# Patient Record
Sex: Male | Born: 1995 | Race: Black or African American | Hispanic: No | Marital: Single | Smoking: Never smoker
Health system: Southern US, Community
[De-identification: ages and names within clinical notes are randomized; demographics above are authoritative.]

---
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
## 2015-09-14 ENCOUNTER — Emergency Department (HOSPITAL_COMMUNITY)
Admission: EM | Admit: 2015-09-14 | Discharge: 2015-09-15 | Disposition: A | Discharge status: Home / Self Care | Payer: Medicaid Other | Attending: Emergency Medicine | Admitting: Emergency Medicine

## 2015-09-14 ENCOUNTER — Encounter (HOSPITAL_COMMUNITY): Payer: Self-pay | Admitting: Emergency Medicine

## 2015-09-14 DIAGNOSIS — S6991XA Unspecified injury of right wrist, hand and finger(s), initial encounter: Secondary | ICD-10-CM | POA: Insufficient documentation

## 2015-09-14 DIAGNOSIS — W228XXA Striking against or struck by other objects, initial encounter: Secondary | ICD-10-CM | POA: Diagnosis not present

## 2015-09-14 DIAGNOSIS — Y998 Other external cause status: Secondary | ICD-10-CM | POA: Diagnosis not present

## 2015-09-14 DIAGNOSIS — M79641 Pain in right hand: Secondary | ICD-10-CM

## 2015-09-14 DIAGNOSIS — Y9289 Other specified places as the place of occurrence of the external cause: Secondary | ICD-10-CM | POA: Diagnosis not present

## 2015-09-14 DIAGNOSIS — Y9389 Activity, other specified: Secondary | ICD-10-CM | POA: Insufficient documentation

## 2015-09-14 MED ORDER — IBUPROFEN 400 MG PO TABS
800.0000 mg | ORAL_TABLET | Freq: Once | ORAL | Status: AC
  Administered 2015-09-14: 800 mg via ORAL
  Filled 2015-09-14: qty 2

## 2015-09-14 NOTE — ED Notes (Signed)
Pt. accidentally hit his right hand against a car's trunk while horse playing this evening , presents with left hand pain and mild swelling .

## 2015-09-14 NOTE — ED Provider Notes (Signed)
CSN: [id number]     Arrival date & time 09/14/15  2248 History  By signing my name below, I, Matthew Brewer, attest that this documentation has been prepared under the direction and in the presence of Roxy Horseman, PA-C. Electronically Signed: Doreatha Brewer, ED Scribe. 09/14/2015. 11:45 PM.    Chief Complaint  Patient presents with  . Hand Injury   The history is provided by the patient. No language interpreter was used.   HPI Comments: Matthew Brewer is a 20 y.o. male who presents to the Emergency Department with a chief complaint of moderate right hand pain s/p injury tonight. Pt states that he accidentally punched the hood of his car with a closed fist, but was aiming for a person. He states pain is worsened with movement. He is able to flex and extend the wrist and fingers. Denies numbness, additional injuries.   History reviewed. No pertinent past medical history. History reviewed. No pertinent past surgical history. Family History  Problem Relation Age of Onset  . Cancer Other    Social History  Substance Use Topics  . Smoking status: Never Smoker   . Smokeless tobacco: None  . Alcohol Use: No     Comment: only occasional use    Review of Systems  Musculoskeletal: Positive for arthralgias (right hand).  Neurological: Negative for numbness.   Allergies  Review of patient's allergies indicates no known allergies.  Home Medications   Prior to Admission medications   Medication Sig Start Date End Date Taking? Authorizing Provider  albuterol (PROVENTIL HFA;VENTOLIN HFA) 108 (90 BASE) MCG/ACT inhaler Inhale 2 puffs into the lungs every 2 (two) hours as needed for wheezing or shortness of breath (cough). 11/19/13   Junious Silk, PA-C  cetirizine (ZYRTEC ALLERGY) 10 MG tablet Take 1 tablet (10 mg total) by mouth daily. 11/05/13   Hannah Muthersbaugh, PA-C  diphenhydrAMINE (BENADRYL) 25 MG tablet Take 25 mg by mouth every 6 (six) hours as needed for allergies.    Historical Provider,  MD  loratadine (CLARITIN) 10 MG tablet Take 1 tablet (10 mg total) by mouth daily. 11/19/13   Junious Silk, PA-C  predniSONE (DELTASONE) 20 MG tablet Take 2 tablets (40 mg total) by mouth daily. 11/05/13   Hannah Muthersbaugh, PA-C   BP 127/73 mmHg  Pulse 89  Temp(Src) 98.3 F (36.8 C) (Oral)  Resp 16  SpO2 99% Physical Exam Physical Exam  Constitutional: Pt appears well-developed and well-nourished. No distress.  HENT:  Head: Normocephalic and atraumatic.  Eyes: Conjunctivae are normal.  Neck: Normal range of motion.  Cardiovascular: Normal rate, regular rhythm and intact distal pulses.   Capillary refill < 3 sec  Pulmonary/Chest: Effort normal and breath sounds normal.  Musculoskeletal: Pt exhibits tenderness to right 4th and 5th metacarpals. Pt exhibits no edema.  ROM: 4/5 limited secondary to pain  Neurological: Pt  is alert. Coordination normal.  Sensation 5/5 Strength 4/5 limited secondary to pain   Skin: Skin is warm and dry. Pt is not diaphoretic.  No tenting of the skin  Psychiatric: Pt has a normal mood and affect.  Nursing note and vitals reviewed.   ED Course  Procedures (including critical care time) DIAGNOSTIC STUDIES: Oxygen Saturation is 99% on RA, normal by my interpretation.    COORDINATION OF CARE: 11:43 PM Discussed treatment plan with pt at bedside which includes XR and pt agreed to plan.  Imaging Review Dg Hand Complete Right  09/15/2015  CLINICAL DATA:  Right fourth and fifth metacarpal pain and  swelling after punching the truck of his car. EXAM: RIGHT HAND - COMPLETE 3+ VIEW COMPARISON:  07/23/2011 FINDINGS: Soft tissue swelling over the metacarpal phalangeal joints. No evidence of acute fracture or subluxation. No focal bone lesion or bone destruction. Bone cortex and trabecular architecture appear intact. No radiopaque soft tissue foreign bodies. IMPRESSION: Soft tissue swelling.  No acute bony abnormalities. Electronically Signed   By: Burman NievesWilliam   Stevens M.D.   On: 09/15/2015 01:03   I have personally reviewed and evaluated these images as part of my medical decision-making.   MDM   Final diagnoses:  Right hand pain    Patient X-Ray negative for obvious fracture or dislocation.  Pt advised to follow up with PCP. Patient given splint while in ED, conservative therapy recommended and discussed. Patient will be discharged home & is agreeable with above plan. Returns precautions discussed. Pt appears safe for discharge.   I personally performed the services described in this documentation, which was scribed in my presence. The recorded information has been reviewed and is accurate.     Roxy Horsemanobert Danyon Mcginness, PA-C 09/15/15 0109  Laurence Spatesachel Morgan Little, MD 09/15/15 [phone]

## 2015-09-15 ENCOUNTER — Emergency Department (HOSPITAL_COMMUNITY): Payer: Medicaid Other

## 2015-09-15 MED ORDER — IBUPROFEN 800 MG PO TABS: 800.0000 mg | ORAL_TABLET | Freq: Three times a day (TID) | ORAL | Status: AC

## 2015-09-15 NOTE — ED Notes (Signed)
Pt verbalized understanding of discharge instructions and follow-up care. Demonstrated appropriate application of wrist splint. 

## 2015-09-15 NOTE — Discharge Instructions (Signed)
Intermetacarpal Sprain °The intermetacarpal ligaments run between the knuckles at the base of the fingers. These ligaments are vulnerable to sprain and injury in which the ligament becomes overstretched or torn. Intermetacarpal sprains are classified into 3 categories. Grade 1 sprains cause pain, but the tendon is not lengthened. Grade 2 sprains include a lengthened ligament, due to the ligament being stretched or partially ruptured. With grade 2 sprains there is still function, although function may be decreased. Grade 3 sprains include a complete tear of the ligament, and the joint usually displays a loss of function.  °SYMPTOMS  °· Severe pain at the time of injury. °· Often, a feeling of popping or tearing inside the hand. °· Tenderness and inflammation at the knuckles. °· Bruising within a couple days of injury. °· Impaired ability to use the hand. °CAUSES  °This condition occurs when the intermetacarpal ligaments are subjected to a greater stress than they can handle. This causes the ligaments to become stretched or torn. °RISK INCREASES WITH: °· Previous hand injury. °· Fighting sports (boxing, wrestling, martial arts). °· Sports in which you could fall on an outstretched hand (soccer, basketball, volleyball). °· Other sports with repeated hand trauma (water polo, gymnastics). °· Poor hand strength and flexibility. °· Inadequate or poorly fitted protective equipment. °PREVENTION  °· Warm up and stretch properly before activity. °· Maintain appropriate conditioning: °¨ Hand flexibility. °¨ Muscle strength and endurance. °· Applying tape, protective strapping, or a brace may help prevent injury. °· Provide the hand with support during sports and practice activities for 6 to 12 months following injury. °PROGNOSIS  °With proper treatment, healing should occur without impairment. The length of healing varies from 2 to 12 weeks, depending on the severity of injury. °RELATED COMPLICATIONS  °· Longer healing time, if  activities are resumed too soon. °· Recurring symptoms or repeated injury, resulting in a chronic problem. °· Injury to other nearby structures (bone, cartilage, tendon). °· Arthritis of the knuckle (intermetacarpal) joint, with repeated sprains. °· Prolonged disability (sometimes). °· Hand and finger stiffness or weakness. °TREATMENT °Treatment first involves ice and medicine to reduce pain and inflammation. An elastic compression bandage may be worn to reduce discomfort and to protect the area. Depending on the severity of injury, you may be required to restrain the area with a cast, splint, or brace. After the ligament has been allowed to heal, strengthening and stretching exercises may be needed to regain strength and a full range of motion. Exercises may be completed at home or with a therapist. Surgery is rarely needed. °MEDICATION  °· If pain medicine is needed, nonsteroidal anti-inflammatory medicines (aspirin and ibuprofen), or other minor pain relievers (acetaminophen), are often advised. °· Do not take pain medicine for 7 days before surgery. °· Stronger pain relievers may be prescribed if your caregiver thinks they are needed. Use only as directed and only as much as you need. °HEAT AND COLD °· Cold treatment (icing) should be applied for 10 to 15 minutes every 2 to 3 hours for inflammation and pain, and immediately after activity that aggravates your symptoms. Use ice packs or an ice massage. °· Heat treatment may be used before performing stretching and strengthening activities prescribed by your caregiver, physical therapist, or athletic trainer. Use a heat pack or a warm water soak. °SEEK MEDICAL CARE IF:  °· Symptoms remain or get worse, despite treatment for longer than 2 to 4 weeks. °· You experience pain, numbness, discoloration, or coldness in the hand or fingers. °·   You develop blue, gray, or dark fingernails. °· Any of the following occur after surgery: increased pain, swelling, redness,  drainage of fluids, bleeding in the affected area, or signs of infection, including fever. °· New, unexplained symptoms develop. (Drugs used in treatment may produce side effects.) °  °This information is not intended to replace advice given to you by your health care provider. Make sure you discuss any questions you have with your health care provider. °  °Document Released: 05/17/2005 Document Revised: 06/07/2014 Document Reviewed: 11/04/2014 °Elsevier Interactive Patient Education ©2016 [employer] ° °

## 2016-10-11 ENCOUNTER — Emergency Department (HOSPITAL_COMMUNITY)
Admission: EM | Admit: 2016-10-11 | Discharge: 2016-10-11 | Disposition: A | Discharge status: Home / Self Care | Payer: Medicaid Other | Attending: Emergency Medicine | Admitting: Emergency Medicine

## 2016-10-11 ENCOUNTER — Encounter (HOSPITAL_COMMUNITY): Payer: Self-pay

## 2016-10-11 DIAGNOSIS — R369 Urethral discharge, unspecified: Secondary | ICD-10-CM

## 2016-10-11 DIAGNOSIS — R360 Urethral discharge without blood: Secondary | ICD-10-CM | POA: Insufficient documentation

## 2016-10-11 DIAGNOSIS — Z79899 Other long term (current) drug therapy: Secondary | ICD-10-CM | POA: Insufficient documentation

## 2016-10-11 LAB — URINALYSIS, ROUTINE W REFLEX MICROSCOPIC
BILIRUBIN URINE: NEGATIVE
Glucose, UA: NEGATIVE mg/dL
Hgb urine dipstick: NEGATIVE
Ketones, ur: NEGATIVE mg/dL
Leukocytes, UA: NEGATIVE
Nitrite: NEGATIVE
PH: 7 (ref 5.0–8.0)
Protein, ur: NEGATIVE mg/dL
SPECIFIC GRAVITY, URINE: 1.018 (ref 1.005–1.030)

## 2016-10-11 LAB — URINE CYTOLOGY ANCILLARY ONLY
Chlamydia: NEGATIVE
Neisseria Gonorrhea: NEGATIVE

## 2016-10-11 LAB — GC/CHLAMYDIA PROBE AMP (~~LOC~~) NOT AT ARMC
Chlamydia: NEGATIVE
Neisseria Gonorrhea: NEGATIVE

## 2016-10-11 MED ORDER — STERILE WATER FOR INJECTION IJ SOLN
INTRAMUSCULAR | Status: AC
  Filled 2016-10-11: qty 10

## 2016-10-11 MED ORDER — CEFTRIAXONE SODIUM 250 MG IJ SOLR
250.0000 mg | Freq: Once | INTRAMUSCULAR | Status: AC
  Administered 2016-10-11: 250 mg via INTRAMUSCULAR
  Filled 2016-10-11: qty 250

## 2016-10-11 MED ORDER — AZITHROMYCIN 250 MG PO TABS
1000.0000 mg | ORAL_TABLET | Freq: Once | ORAL | Status: AC
  Administered 2016-10-11: 1000 mg via ORAL
  Filled 2016-10-11: qty 4

## 2016-10-11 NOTE — ED Notes (Signed)
Pt understood dc material. NAD noted. 

## 2016-10-11 NOTE — ED Provider Notes (Signed)
[facility]-EMERGENCY DEPT Provider Note   CSN: [id number] Arrival date & time: 10/11/16  0044     History   Chief Complaint Chief Complaint  Patient presents with  . Exposure to STD    HPI Matthew Brewer is a 21 y.o. male.  Patient is here for evaluation of penile discharge. He had symptoms intermittently over the last 2 days. No abdominal, testicular or scrotal pain. No nausea, vomiting or fever. No dysuria.    The history is provided by the patient. No language interpreter was used.  Exposure to STD  Pertinent negatives include no abdominal pain.    History reviewed. No pertinent past medical history.  Patient Active Problem List   Diagnosis Date Noted  . Major depression, single episode 02/03/2013  . Suicide and self-inflicted injury ([facility]) 02/02/2013    History reviewed. No pertinent surgical history.     Home Medications    Prior to Admission medications   Medication Sig Start Date End Date Taking? Authorizing Provider  albuterol (PROVENTIL HFA;VENTOLIN HFA) 108 (90 BASE) MCG/ACT inhaler Inhale 2 puffs into the lungs every 2 (two) hours as needed for wheezing or shortness of breath (cough). 11/19/13   Junious SilkMerrell, Hannah, PA-C  cetirizine (ZYRTEC ALLERGY) 10 MG tablet Take 1 tablet (10 mg total) by mouth daily. 11/05/13   Muthersbaugh, Dahlia ClientHannah, PA-C  diphenhydrAMINE (BENADRYL) 25 MG tablet Take 25 mg by mouth every 6 (six) hours as needed for allergies.    [provider]  ibuprofen (ADVIL,MOTRIN) 800 MG tablet Take 1 tablet (800 mg total) by mouth 3 (three) times daily. 09/15/15   Roxy HorsemanBrowning, Robert, PA-C  loratadine (CLARITIN) 10 MG tablet Take 1 tablet (10 mg total) by mouth daily. 11/19/13   Junious SilkMerrell, Hannah, PA-C  predniSONE (DELTASONE) 20 MG tablet Take 2 tablets (40 mg total) by mouth daily. 11/05/13   Muthersbaugh, Dahlia ClientHannah, PA-C    Family History Family History  Problem Relation Age of Onset  . Cancer Other     Social History Social History  Substance Use  Topics  . Smoking status: Never Smoker  . Smokeless tobacco: Never Used  . Alcohol use No     Comment: only occasional use     Allergies   Patient has no known allergies.   Review of Systems Review of Systems  Constitutional: Negative for fever.  Gastrointestinal: Negative for abdominal pain and nausea.  Genitourinary: Positive for discharge. Negative for dysuria.  Musculoskeletal: Negative for myalgias.     Physical Exam Updated Vital Signs BP 126/62 (BP Location: Right Arm)   Pulse 73   Temp 99.2 F (37.3 C) (Oral)   Resp 14   Ht 6' (1.829 m)   Wt 70.3 kg   SpO2 100%   BMI 21.02 kg/m   Physical Exam  Constitutional: He is oriented to person, place, and time. He appears well-developed and well-nourished.  Neck: Normal range of motion.  Pulmonary/Chest: Effort normal.  Genitourinary:  Genitourinary Comments: Circumcised penis without visualized discharge. No testicular tenderness or scrotal swelling.  Musculoskeletal: Normal range of motion.  Lymphadenopathy:       Right: Inguinal adenopathy present.       Left: Inguinal adenopathy present.  Neurological: He is alert and oriented to person, place, and time.  Skin: Skin is warm and dry.  Psychiatric: He has a normal mood and affect.     ED Treatments / Results  Labs (all labs ordered are listed, but only abnormal results are displayed) Labs Reviewed  URINALYSIS, ROUTINE W REFLEX  MICROSCOPIC - Abnormal; Notable for the following:       Result Value   APPearance HAZY (*)    All other components within normal limits  GC/CHLAMYDIA PROBE AMP ([place]) NOT AT [facility]    EKG  EKG Interpretation None       Radiology No results found.  Procedures Procedures (including critical care time)  Medications Ordered in ED Medications  azithromycin (ZITHROMAX) tablet 1,000 mg (not administered)  cefTRIAXone (ROCEPHIN) injection 250 mg (not administered)     Initial Impression / Assessment and Plan / ED  Course  I have reviewed the triage vital signs and the nursing notes.  Pertinent labs & imaging results that were available during my care of the patient were reviewed by me and considered in my medical decision making (see chart for details).     Patient with reported penile discharge, none on exam tonight. Cultures pending. Patient is treated with Azithromycin and Rocephin.   Final Clinical Impressions(s) / ED Diagnoses   Final diagnoses:  Penile discharge    New Prescriptions New Prescriptions   No medications on file     Danne Harbor 10/11/16 4540    Dione Booze, MD 10/11/16 [phone]

## 2016-10-11 NOTE — ED Triage Notes (Signed)
Pt complaining of some penile discharge. Pt denies any bleeding. Pt denies any abdominal or testicular pain.

## 2017-06-30 ENCOUNTER — Encounter (HOSPITAL_COMMUNITY): Payer: Self-pay

## 2017-06-30 ENCOUNTER — Other Ambulatory Visit: Payer: Self-pay

## 2017-06-30 ENCOUNTER — Ambulatory Visit (HOSPITAL_COMMUNITY)
Admission: EM | Admit: 2017-06-30 | Discharge: 2017-06-30 | Disposition: A | Discharge status: Home / Self Care | Payer: Medicaid Other | Attending: Family Medicine | Admitting: Family Medicine

## 2017-06-30 DIAGNOSIS — R300 Dysuria: Secondary | ICD-10-CM | POA: Diagnosis not present

## 2017-06-30 DIAGNOSIS — Z202 Contact with and (suspected) exposure to infections with a predominantly sexual mode of transmission: Secondary | ICD-10-CM

## 2017-06-30 DIAGNOSIS — R369 Urethral discharge, unspecified: Secondary | ICD-10-CM | POA: Diagnosis not present

## 2017-06-30 DIAGNOSIS — Z113 Encounter for screening for infections with a predominantly sexual mode of transmission: Secondary | ICD-10-CM | POA: Diagnosis not present

## 2017-06-30 DIAGNOSIS — F329 Major depressive disorder, single episode, unspecified: Secondary | ICD-10-CM | POA: Insufficient documentation

## 2017-06-30 DIAGNOSIS — Z711 Person with feared health complaint in whom no diagnosis is made: Secondary | ICD-10-CM

## 2017-06-30 LAB — POCT URINALYSIS DIP (DEVICE)
Bilirubin Urine: NEGATIVE
Glucose, UA: NEGATIVE mg/dL
Hgb urine dipstick: NEGATIVE
Ketones, ur: NEGATIVE mg/dL
Leukocytes, UA: NEGATIVE
Nitrite: NEGATIVE
PH: 7 (ref 5.0–8.0)
PROTEIN: NEGATIVE mg/dL
Specific Gravity, Urine: 1.025 (ref 1.005–1.030)
Urobilinogen, UA: 2 mg/dL — ABNORMAL HIGH (ref 0.0–1.0)

## 2017-06-30 MED ORDER — LIDOCAINE HCL (PF) 2 % IJ SOLN
INTRAMUSCULAR | Status: AC
  Filled 2017-06-30: qty 2

## 2017-06-30 MED ORDER — CEFTRIAXONE SODIUM 250 MG IJ SOLR
INTRAMUSCULAR | Status: AC
Start: 2017-06-30 — End: 2017-06-30
  Filled 2017-06-30: qty 250

## 2017-06-30 MED ORDER — AZITHROMYCIN 250 MG PO TABS
ORAL_TABLET | ORAL | Status: AC
  Filled 2017-06-30: qty 4

## 2017-06-30 MED ORDER — CEFTRIAXONE SODIUM 250 MG IJ SOLR
250.0000 mg | Freq: Once | INTRAMUSCULAR | Status: AC
  Administered 2017-06-30: 250 mg via INTRAMUSCULAR

## 2017-06-30 MED ORDER — AZITHROMYCIN 250 MG PO TABS
1000.0000 mg | ORAL_TABLET | Freq: Once | ORAL | Status: AC
  Administered 2017-06-30: 1000 mg via ORAL

## 2017-06-30 MED ORDER — LIDOCAINE HCL (PF) 1 % IJ SOLN
INTRAMUSCULAR | Status: AC
  Filled 2017-06-30: qty 2

## 2017-06-30 NOTE — Discharge Instructions (Signed)
We have treated you today for gonorrhea and chlamydia.  °Will notify you of any positive findings and if any changes to treatment are needed.   °Please withhold from intercourse for the next week. °Please use condoms to prevent STD's.   °

## 2017-06-30 NOTE — ED Triage Notes (Signed)
[facility], pt is experiencing discharge from penis and just uncomfortable feeling.
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
## 2017-07-01 LAB — URINE CYTOLOGY ANCILLARY ONLY
CHLAMYDIA, DNA PROBE: POSITIVE — AB
NEISSERIA GONORRHEA: NEGATIVE
Trichomonas: NEGATIVE

## 2018-02-09 ENCOUNTER — Emergency Department (HOSPITAL_COMMUNITY): Payer: Medicaid Other

## 2018-02-09 ENCOUNTER — Emergency Department (HOSPITAL_COMMUNITY)
Admission: EM | Admit: 2018-02-09 | Discharge: 2018-02-09 | Discharge status: Against Medical Advice | Payer: Medicaid Other | Attending: Emergency Medicine | Admitting: Emergency Medicine

## 2018-02-09 DIAGNOSIS — Z5321 Procedure and treatment not carried out due to patient leaving prior to being seen by health care provider: Secondary | ICD-10-CM | POA: Insufficient documentation

## 2018-02-09 DIAGNOSIS — R52 Pain, unspecified: Secondary | ICD-10-CM

## 2018-02-09 DIAGNOSIS — M25532 Pain in left wrist: Secondary | ICD-10-CM | POA: Insufficient documentation

## 2018-06-09 DIAGNOSIS — Z79899 Other long term (current) drug therapy: Secondary | ICD-10-CM | POA: Insufficient documentation

## 2018-06-09 DIAGNOSIS — H109 Unspecified conjunctivitis: Secondary | ICD-10-CM | POA: Insufficient documentation

## 2018-06-09 DIAGNOSIS — J028 Acute pharyngitis due to other specified organisms: Secondary | ICD-10-CM | POA: Insufficient documentation

## 2018-06-09 DIAGNOSIS — B970 Adenovirus as the cause of diseases classified elsewhere: Secondary | ICD-10-CM | POA: Insufficient documentation

## 2018-06-10 ENCOUNTER — Emergency Department (HOSPITAL_COMMUNITY)
Admission: EM | Admit: 2018-06-10 | Discharge: 2018-06-10 | Disposition: A | Discharge status: Home / Self Care | Payer: Medicaid Other | Attending: Emergency Medicine | Admitting: Emergency Medicine

## 2018-06-10 ENCOUNTER — Encounter (HOSPITAL_COMMUNITY): Payer: Self-pay | Admitting: Emergency Medicine

## 2018-06-10 DIAGNOSIS — B970 Adenovirus as the cause of diseases classified elsewhere: Secondary | ICD-10-CM

## 2018-06-10 DIAGNOSIS — J028 Acute pharyngitis due to other specified organisms: Secondary | ICD-10-CM

## 2018-06-10 LAB — GROUP A STREP BY PCR: GROUP A STREP BY PCR: NOT DETECTED
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
## 2018-06-10 NOTE — Discharge Instructions (Signed)
Take tylenol 2 pills 4 times a day and motrin 4 pills 3 times a day.  Drink plenty of fluids.  Return for worsening shortness of breath, headache, confusion. Follow up with your family doctor.   

## 2018-06-10 NOTE — ED Triage Notes (Signed)
Pt reports sore throat X few days. Mild tonsil irritation noted.

## 2018-12-29 ENCOUNTER — Encounter (HOSPITAL_COMMUNITY): Payer: Self-pay | Admitting: Emergency Medicine

## 2018-12-29 ENCOUNTER — Ambulatory Visit (HOSPITAL_COMMUNITY)
Admission: EM | Admit: 2018-12-29 | Discharge: 2018-12-29 | Disposition: A | Discharge status: Home / Self Care | Payer: Medicaid Other | Attending: Urgent Care | Admitting: Urgent Care

## 2018-12-29 ENCOUNTER — Other Ambulatory Visit: Payer: Self-pay

## 2018-12-29 DIAGNOSIS — S5011XA Contusion of right forearm, initial encounter: Secondary | ICD-10-CM

## 2018-12-29 DIAGNOSIS — S46911A Strain of unspecified muscle, fascia and tendon at shoulder and upper arm level, right arm, initial encounter: Secondary | ICD-10-CM

## 2018-12-29 DIAGNOSIS — M62838 Other muscle spasm: Secondary | ICD-10-CM

## 2018-12-29 DIAGNOSIS — W1789XA Other fall from one level to another, initial encounter: Secondary | ICD-10-CM

## 2018-12-29 DIAGNOSIS — W19XXXA Unspecified fall, initial encounter: Secondary | ICD-10-CM

## 2018-12-29 DIAGNOSIS — Y9259 Other trade areas as the place of occurrence of the external cause: Secondary | ICD-10-CM

## 2018-12-29 DIAGNOSIS — S46811A Strain of other muscles, fascia and tendons at shoulder and upper arm level, right arm, initial encounter: Secondary | ICD-10-CM

## 2018-12-29 DIAGNOSIS — S5001XA Contusion of right elbow, initial encounter: Secondary | ICD-10-CM

## 2018-12-29 MED ORDER — NAPROXEN 500 MG PO TABS: 500.0000 mg | ORAL_TABLET | Freq: Two times a day (BID) | ORAL | 0 refills | Status: DC

## 2018-12-29 MED ORDER — CYCLOBENZAPRINE HCL 5 MG PO TABS: 5.0000 mg | ORAL_TABLET | Freq: Three times a day (TID) | ORAL | 0 refills | Status: DC | PRN

## 2018-12-29 NOTE — ED Provider Notes (Signed)
  MRN: [id number] DOB: December 16, 1995  Subjective:   Matthew Brewer is a 23 y.o. male presenting for worker's comp visit. Patient fell from a box while at work, lost his footing. He broke his fall landing on his right elbow and has had acute worsening persistent moderate elbow pain, right shoulder pain, trapezius pain, upper right back pain worse when trying to bend at level of his neck. Tried Aleve once this morning without any relief.    Matthew Brewer's medications list, allergies, past medical history and past surgical history were reviewed and excluded from this note due to being a worker's comp case.   Objective:   Vitals: BP 131/81 (BP Location: Right Arm)   Pulse 80   Temp 98.4 F (36.9 C) (Oral)   Resp 18   SpO2 96%   Physical Exam Constitutional:      Appearance: Normal appearance. He is well-developed and normal weight.  HENT:     Head: Normocephalic and atraumatic.     Right Ear: External ear normal.     Left Ear: External ear normal.     Nose: Nose normal.     Mouth/Throat:     Pharynx: Oropharynx is clear.  Eyes:     Extraocular Movements: Extraocular movements intact.     Pupils: Pupils are equal, round, and reactive to light.  Cardiovascular:     Rate and Rhythm: Normal rate.  Pulmonary:     Effort: Pulmonary effort is normal.  Musculoskeletal:     Right shoulder: He exhibits decreased range of motion (Secondary to pain of his trapezius) and spasm (Significant over right trapezius extending up to paraspinal muscles at cervical region). He exhibits no tenderness, no bony tenderness, no swelling, no effusion, no crepitus and no deformity.     Right elbow: He exhibits normal range of motion, no swelling, no effusion, no deformity and no laceration. Tenderness found. Olecranon process tenderness noted. No radial head, no medial epicondyle and no lateral epicondyle tenderness noted.  Neurological:     Mental Status: He is alert and oriented to person, place, and time.  Psychiatric:         Mood and Affect: Mood normal.        Behavior: Behavior normal.     Assessment and Plan :   1. Trapezius muscle strain, right, initial encounter   2. Fall, initial encounter   3. Contusion of right elbow, initial encounter   4. Shoulder strain, right, initial encounter   5. Trapezius muscle spasm   6. Contusion of right elbow and forearm, initial encounter    We will manage conservatively for musculoskeletal type pain associated from his work injury.  Counseled on use of NSAID, muscle relaxant and modification of physical activities.  Work restrictions provided.  Patient is to follow-up with occupational health.  Counseled patient on potential for adverse effects with medications prescribed/recommended today, ER and return-to-clinic precautions discussed, patient verbalized understanding.   Jaynee Eagles, PA-C [facility] [phone] 12/29/2018 10:56 AM    Jaynee Eagles, PA-C 12/29/18 1109

## 2018-12-29 NOTE — ED Triage Notes (Signed)
Pt here for fall yesterday at work; pt sts back and right shoulder pain; pt sts right elbow pain

## 2020-04-04 IMAGING — CR DG WRIST COMPLETE 3+V*L*
4 series · 4 of 4 positions shown · non-contrast
Comparison: None.

CLINICAL DATA: Left wrist pain and popping.

EXAM:
LEFT WRIST - COMPLETE 3+ VIEW

[wrist pa]
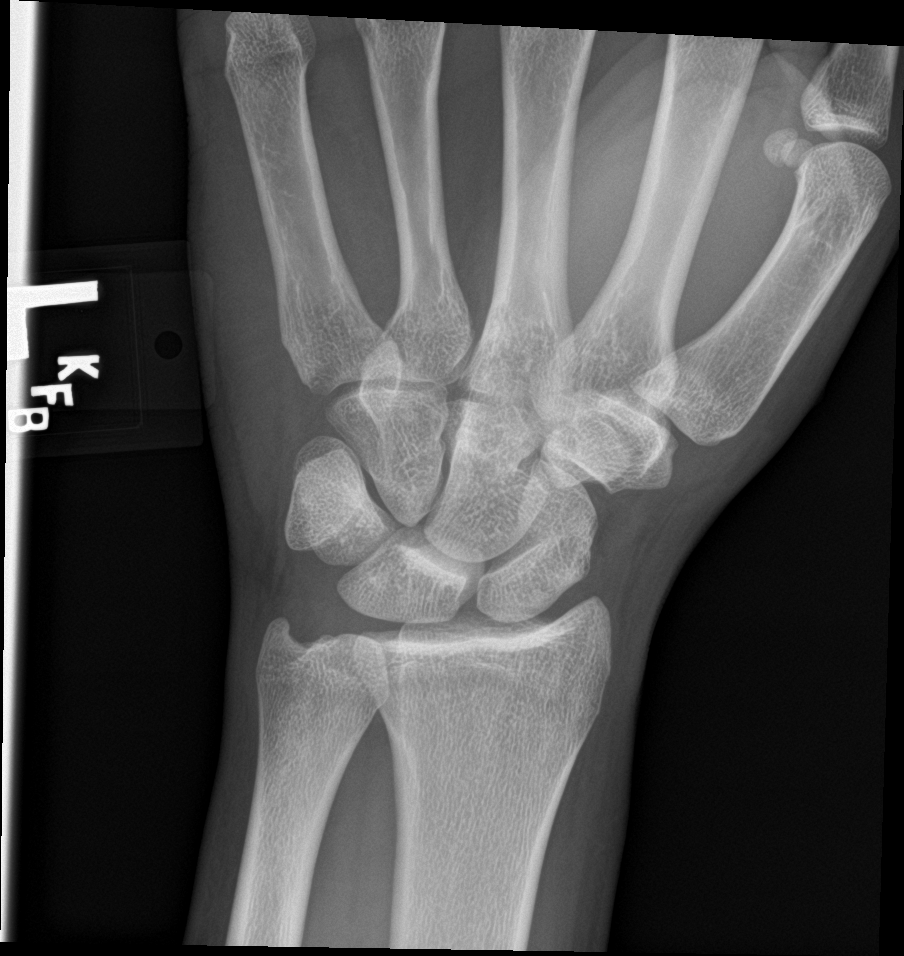

[wrist obl]
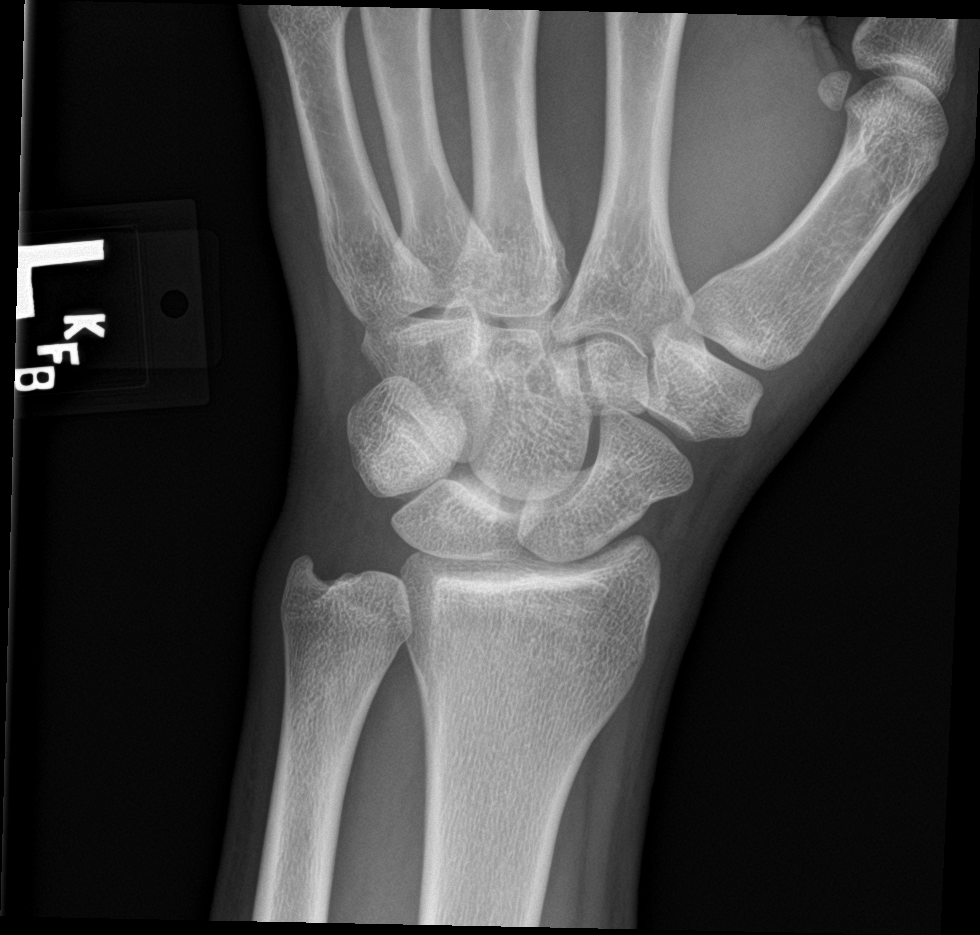

[wrist lat]
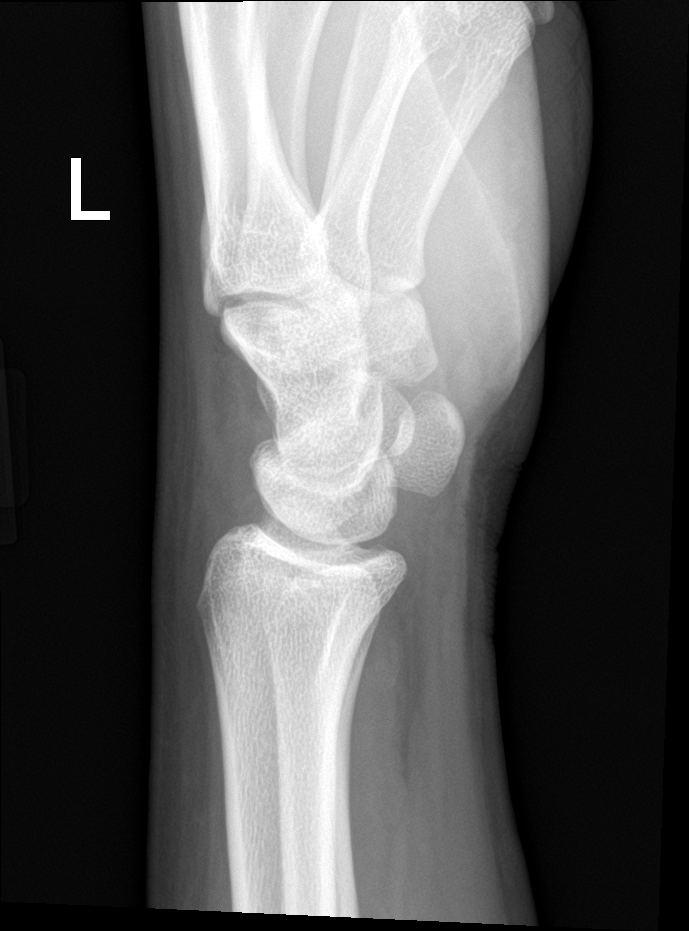

[wrist navicular]
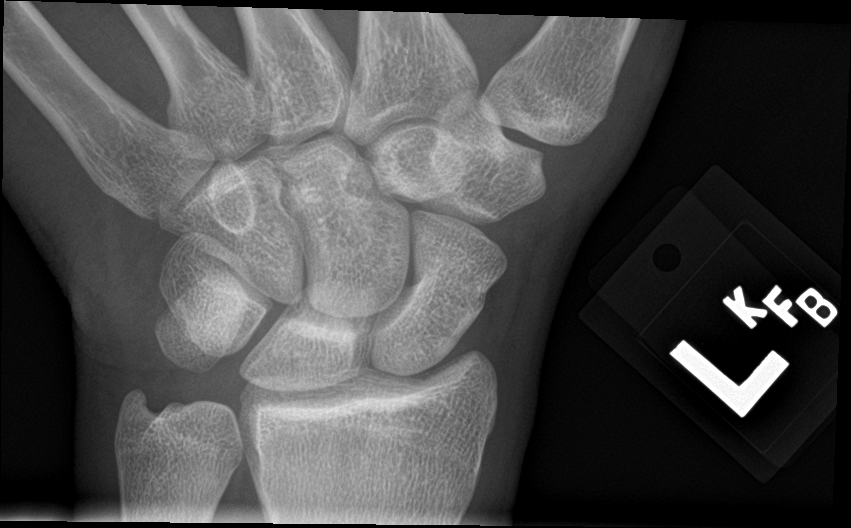

[4 of 4 positions shown; findings below may reference images not displayed]

FINDINGS: There is no evidence of fracture or dislocation. There is no
evidence of arthropathy or other focal bone abnormality. Soft
tissues are unremarkable.
IMPRESSION: Negative.

## 2020-05-11 ENCOUNTER — Emergency Department (HOSPITAL_BASED_OUTPATIENT_CLINIC_OR_DEPARTMENT_OTHER)
Admission: EM | Admit: 2020-05-11 | Discharge: 2020-05-11 | Disposition: A | Discharge status: Home / Self Care | Payer: Medicaid Other | Attending: Emergency Medicine | Admitting: Emergency Medicine

## 2020-05-11 ENCOUNTER — Other Ambulatory Visit: Payer: Self-pay

## 2020-05-11 ENCOUNTER — Encounter (HOSPITAL_BASED_OUTPATIENT_CLINIC_OR_DEPARTMENT_OTHER): Payer: Self-pay | Admitting: Emergency Medicine

## 2020-05-11 DIAGNOSIS — Y92410 Unspecified street and highway as the place of occurrence of the external cause: Secondary | ICD-10-CM | POA: Insufficient documentation

## 2020-05-11 DIAGNOSIS — M545 Low back pain, unspecified: Secondary | ICD-10-CM | POA: Insufficient documentation

## 2020-05-11 MED ORDER — CYCLOBENZAPRINE HCL 5 MG PO TABS: 5.0000 mg | ORAL_TABLET | Freq: Two times a day (BID) | ORAL | 0 refills | Status: DC | PRN

## 2020-05-11 MED ORDER — NAPROXEN 250 MG PO TABS
500.0000 mg | ORAL_TABLET | Freq: Once | ORAL | Status: AC
  Administered 2020-05-11: 500 mg via ORAL
  Filled 2020-05-11: qty 2

## 2020-05-11 MED ORDER — NAPROXEN 500 MG PO TABS: 500.0000 mg | ORAL_TABLET | Freq: Two times a day (BID) | ORAL | 0 refills | Status: DC
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
## 2024-05-08 ENCOUNTER — Ambulatory Visit (HOSPITAL_COMMUNITY): Admission: EM | Admit: 2024-05-08 | Discharge: 2024-05-08 | Discharge status: Against Medical Advice

## 2024-05-08 NOTE — ED Notes (Signed)
Called from lobby no answer X 2  

## 2024-05-08 NOTE — ED Notes (Signed)
 Pt called from lobby no answer. X3

## 2024-05-08 NOTE — ED Notes (Signed)
Called from lobby no answer X 1

## 2024-05-09 DIAGNOSIS — M79641 Pain in right hand: Secondary | ICD-10-CM | POA: Diagnosis not present

## 2024-05-09 DIAGNOSIS — X58XXXA Exposure to other specified factors, initial encounter: Secondary | ICD-10-CM | POA: Diagnosis not present

## 2024-05-09 DIAGNOSIS — S60221A Contusion of right hand, initial encounter: Secondary | ICD-10-CM | POA: Diagnosis not present

## 2024-05-30 ENCOUNTER — Other Ambulatory Visit: Payer: Self-pay

## 2024-05-30 ENCOUNTER — Emergency Department (HOSPITAL_COMMUNITY)
Admission: EM | Admit: 2024-05-30 | Discharge: 2024-05-31 | Disposition: A | Discharge status: Home / Self Care | Attending: Emergency Medicine | Admitting: Emergency Medicine

## 2024-05-30 ENCOUNTER — Encounter (HOSPITAL_COMMUNITY): Payer: Self-pay

## 2024-05-30 ENCOUNTER — Emergency Department (HOSPITAL_COMMUNITY)

## 2024-05-30 DIAGNOSIS — M79641 Pain in right hand: Secondary | ICD-10-CM | POA: Diagnosis not present

## 2024-05-30 DIAGNOSIS — S50811A Abrasion of right forearm, initial encounter: Secondary | ICD-10-CM | POA: Insufficient documentation

## 2024-05-30 DIAGNOSIS — S59911A Unspecified injury of right forearm, initial encounter: Secondary | ICD-10-CM | POA: Diagnosis present

## 2024-05-30 DIAGNOSIS — R519 Headache, unspecified: Secondary | ICD-10-CM | POA: Insufficient documentation

## 2024-05-30 DIAGNOSIS — M542 Cervicalgia: Secondary | ICD-10-CM | POA: Insufficient documentation

## 2024-05-30 DIAGNOSIS — M25572 Pain in left ankle and joints of left foot: Secondary | ICD-10-CM | POA: Insufficient documentation

## 2024-05-30 DIAGNOSIS — Y92410 Unspecified street and highway as the place of occurrence of the external cause: Secondary | ICD-10-CM | POA: Insufficient documentation
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
## 2024-05-31 ENCOUNTER — Emergency Department (HOSPITAL_COMMUNITY)

## 2024-05-31 MED ORDER — ACETAMINOPHEN 325 MG PO TABS
650.0000 mg | ORAL_TABLET | ORAL | Status: AC
  Administered 2024-05-31: 650 mg via ORAL
  Filled 2024-05-31: qty 2

## 2024-05-31 MED ORDER — CYCLOBENZAPRINE HCL 10 MG PO TABS: 10.0000 mg | ORAL_TABLET | Freq: Two times a day (BID) | ORAL | 0 refills | Status: AC | PRN

## 2024-05-31 MED ORDER — NAPROXEN 250 MG PO TABS
500.0000 mg | ORAL_TABLET | Freq: Once | ORAL | Status: AC
  Administered 2024-05-31: 500 mg via ORAL
  Filled 2024-05-31: qty 2

## 2024-05-31 MED ORDER — NAPROXEN 500 MG PO TABS: 500.0000 mg | ORAL_TABLET | Freq: Two times a day (BID) | ORAL | 0 refills | Status: AC
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
## 2024-05-31 NOTE — ED Notes (Signed)
Patient discharge by PA.

## 2024-06-01 ENCOUNTER — Other Ambulatory Visit: Payer: Self-pay

## 2024-06-01 ENCOUNTER — Encounter (HOSPITAL_BASED_OUTPATIENT_CLINIC_OR_DEPARTMENT_OTHER): Payer: Self-pay

## 2024-06-01 ENCOUNTER — Emergency Department (HOSPITAL_BASED_OUTPATIENT_CLINIC_OR_DEPARTMENT_OTHER)
Admission: EM | Admit: 2024-06-01 | Discharge: 2024-06-01 | Discharge status: Against Medical Advice | Attending: Emergency Medicine | Admitting: Emergency Medicine

## 2024-06-01 DIAGNOSIS — H538 Other visual disturbances: Secondary | ICD-10-CM | POA: Insufficient documentation

## 2024-06-01 DIAGNOSIS — Y92410 Unspecified street and highway as the place of occurrence of the external cause: Secondary | ICD-10-CM | POA: Diagnosis not present

## 2024-06-01 DIAGNOSIS — R519 Headache, unspecified: Secondary | ICD-10-CM | POA: Insufficient documentation

## 2024-06-01 DIAGNOSIS — Z5321 Procedure and treatment not carried out due to patient leaving prior to being seen by health care provider: Secondary | ICD-10-CM | POA: Insufficient documentation
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
